# Patient Record
Sex: Male | Born: 1995
Health system: Southern US, Community
[De-identification: ages and names within clinical notes are randomized; demographics above are authoritative.]

## PROBLEM LIST (undated history)

## (undated) HISTORY — PX: NO PAST SURGERIES: SHX2092

---
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
## 2014-01-07 ENCOUNTER — Emergency Department: Payer: Self-pay | Admitting: Emergency Medicine
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
## 2019-12-31 ENCOUNTER — Encounter: Payer: Self-pay | Admitting: Emergency Medicine

## 2019-12-31 ENCOUNTER — Other Ambulatory Visit: Payer: Self-pay

## 2019-12-31 ENCOUNTER — Emergency Department
Admission: EM | Admit: 2019-12-31 | Discharge: 2019-12-31 | Disposition: A | Discharge status: Home / Self Care | Payer: Self-pay | Attending: Emergency Medicine | Admitting: Emergency Medicine

## 2019-12-31 ENCOUNTER — Emergency Department: Payer: Self-pay

## 2019-12-31 DIAGNOSIS — F17210 Nicotine dependence, cigarettes, uncomplicated: Secondary | ICD-10-CM | POA: Insufficient documentation

## 2019-12-31 DIAGNOSIS — M79671 Pain in right foot: Secondary | ICD-10-CM | POA: Insufficient documentation

## 2019-12-31 MED ORDER — IBUPROFEN 600 MG PO TABS
600.0000 mg | ORAL_TABLET | Freq: Four times a day (QID) | ORAL | 0 refills | Status: DC | PRN
Start: 2019-12-31 — End: 2020-03-28

## 2019-12-31 MED ORDER — HYDROCODONE-ACETAMINOPHEN 5-325 MG PO TABS: 1.0000 | ORAL_TABLET | ORAL | 0 refills | Status: DC | PRN

## 2019-12-31 MED ORDER — HYDROCODONE-ACETAMINOPHEN 5-325 MG PO TABS
1.0000 | ORAL_TABLET | Freq: Once | ORAL | Status: AC
  Administered 2019-12-31: 1 via ORAL
  Filled 2019-12-31: qty 1

## 2019-12-31 NOTE — ED Provider Notes (Signed)
[facility] Emergency Department Provider Note  Time seen: 3:39 AM  I have reviewed the triage vital signs and the nursing notes.   HISTORY  Chief Complaint Foot Pain   HPI Dustin Clark is a 24 y.o. male with no significant past medical history presents to the emergency department for right foot pain.  According to the patient he states he walked into a staircase 2 days ago.  States he has had significant pain at the base of the right great toe ever since.  Patient denies any history of gout.  States the pain seemed to worsen today while he was walking on it and he could not get sleep tonight due to the discomfort.  No other injuries.  Describes the pain as moderate aching dull pain.   History reviewed. No pertinent past medical history.  There are no problems to display for this patient.   History reviewed. No pertinent surgical history.  Prior to Admission medications   Not on File    No Known Allergies  No family history on file.  Social History Social History   Tobacco Use  . Smoking status: Current Every Day Smoker  . Smokeless tobacco: Never Used  Substance Use Topics  . Alcohol use: Yes    Comment: occ  . Drug use: Never    Review of Systems Constitutional: Negative for fever. Cardiovascular: Negative for chest pain. Respiratory: Negative for shortness of breath. Gastrointestinal: Negative for abdominal pain, vomiting and diarrhea. Musculoskeletal: Right foot pain. Neurological: Negative for headache All other ROS negative  ____________________________________________   PHYSICAL EXAM:  VITAL SIGNS: ED Triage Vitals  Enc Vitals Group     BP 12/31/19 0240 117/84     Pulse Rate 12/31/19 0240 88     Resp 12/31/19 0240 18     Temp 12/31/19 0240 98.6 F (37 C)     Temp Source 12/31/19 0240 Oral     SpO2 12/31/19 0240 98 %     Weight 12/31/19 0238 273 lb (123.8 kg)     Height 12/31/19 0238 6\' 1"  (1.854 m)     Head  Circumference --      Peak Flow --      Pain Score 12/31/19 0238 9     Pain Loc --      Pain Edu? --      Excl. in [place]? --    Constitutional: Alert and oriented. Well appearing and in no distress. Eyes: Normal exam ENT      Head: Normocephalic and atraumatic.      Mouth/Throat: Mucous membranes are moist. Cardiovascular: Normal rate, regular rhythm. Respiratory: Normal respiratory effort without tachypnea nor retractions. Breath sounds are clear  Gastrointestinal: Soft and nontender. No distention. Musculoskeletal: Moderate tenderness to palpation however the right MTP joint with mild swelling to this area mild erythema. Neurologic:  Normal speech and language. No gross focal neurologic deficits Skin:  Skin is warm, dry and intact.  Psychiatric: Mood and affect are normal.  ____________________________________________     RADIOLOGY  X-ray negative  ____________________________________________   INITIAL IMPRESSION / ASSESSMENT AND PLAN / ED COURSE  Pertinent labs & imaging results that were available during my care of the patient were reviewed by me and considered in my medical decision making (see chart for details).   Patient presents emergency department for right foot pain after hitting it and a staircase 2 days ago.  Overall the patient appears well no other injuries identified on exam reassuring physical  exam.  Does have tenderness to the right MTP joint with mild swelling and erythema to the area.  Could very likely be traumatic injury.  I reviewed the x-ray images do not see any acute fracture.  Patient denies any history of gout.  We will place on a short course of pain medication as well as NSAIDs.  Patient agreeable to plan of care.  Dustin Clark was evaluated in Emergency Department on 12/31/2019 for the symptoms described in the history of present illness. He was evaluated in the context of the global COVID-19 pandemic, which necessitated consideration that the  patient might be at risk for infection with the SARS-CoV-2 virus that causes COVID-19. Institutional protocols and algorithms that pertain to the evaluation of patients at risk for COVID-19 are in a state of rapid change based on information released by regulatory bodies including the CDC and federal and state organizations. These policies and algorithms were followed during the patient's care in the ED.  ____________________________________________   FINAL CLINICAL IMPRESSION(S) / ED DIAGNOSES  Right foot pain   Minna Antis, MD 12/31/19 [phone]

## 2019-12-31 NOTE — ED Triage Notes (Signed)
Patient states that three days ago he was walking and hit his left foot on a stair case. Patient states that he is now having pain.

## 2020-03-28 ENCOUNTER — Other Ambulatory Visit: Payer: Self-pay

## 2020-03-28 ENCOUNTER — Encounter: Payer: Self-pay | Admitting: Emergency Medicine

## 2020-03-28 ENCOUNTER — Ambulatory Visit: Payer: Self-pay

## 2020-03-28 ENCOUNTER — Ambulatory Visit
Admission: EM | Admit: 2020-03-28 | Discharge: 2020-03-28 | Disposition: A | Discharge status: Home / Self Care | Payer: Self-pay | Attending: Family Medicine | Admitting: Family Medicine

## 2020-03-28 DIAGNOSIS — A6920 Lyme disease, unspecified: Secondary | ICD-10-CM

## 2020-03-28 DIAGNOSIS — W57XXXA Bitten or stung by nonvenomous insect and other nonvenomous arthropods, initial encounter: Secondary | ICD-10-CM

## 2020-03-28 MED ORDER — DOXYCYCLINE HYCLATE 100 MG PO CAPS
100.0000 mg | ORAL_CAPSULE | Freq: Two times a day (BID) | ORAL | 0 refills | Status: DC
Start: 2020-03-28 — End: 2023-07-31

## 2020-03-28 NOTE — ED Triage Notes (Signed)
Pt states he was bite by a tick about a week ago on his upper back.. He has a bulls eye type rash. Denies fever or body aches.

## 2020-03-28 NOTE — ED Provider Notes (Signed)
[facility]    CSN: [id number] Arrival date & time: 03/28/20  1032  History   Chief Complaint Chief Complaint  Patient presents with  . Insect Bite    tick   HPI  24 year old male presents with recent tick bite.  Patient reports that he was bitten by tick approximately 1 week ago.  Was bitten on the back.  Patient reports that 2 to 3 days ago he noticed a rash which seems to be enlarging.  He has had no fever or arthralgias or myalgias.  No headache.  He is otherwise feeling well.  However given the rash, he was concerned and decided to come in for evaluation.  No relieving factors.  No other associated symptoms.  No other complaints.  Past Surgical History:  Procedure Laterality Date  . NO PAST SURGERIES     Home Medications    Prior to Admission medications   Medication Sig Start Date End Date Taking? Authorizing Provider  doxycycline (VIBRAMYCIN) 100 MG capsule Take 1 capsule (100 mg total) by mouth 2 (two) times daily. 03/28/20   Tommie Sams, DO    Family History Family History  Problem Relation Age of Onset  . Lupus Mother   . Healthy Father     Social History Social History   Tobacco Use  . Smoking status: Former Smoker    Types: Cigarettes  . Smokeless tobacco: Never Used  Vaping Use  . Vaping Use: Never used  Substance Use Topics  . Alcohol use: Yes    Comment: occ  . Drug use: Yes    Types: Marijuana     Allergies   Patient has no known allergies.   Review of Systems Review of Systems  Constitutional: Negative.   Musculoskeletal: Negative.   Skin: Positive for rash.   Physical Exam Triage Vital Signs ED Triage Vitals  Enc Vitals Group     BP 03/28/20 1106 114/86     Pulse Rate 03/28/20 1106 65     Resp 03/28/20 1106 18     Temp 03/28/20 1106 97.8 F (36.6 C)     Temp Source 03/28/20 1106 Oral     SpO2 03/28/20 1106 100 %     Weight 03/28/20 1104 272 lb 14.9 oz (123.8 kg)     Height 03/28/20 1104 6\' 1"  (1.854 m)      Head Circumference --      Peak Flow --      Pain Score 03/28/20 1104 0     Pain Loc --      Pain Edu? --      Excl. in [place]? --    Updated Vital Signs BP 114/86 (BP Location: Right Arm)   Pulse 65   Temp 97.8 F (36.6 C) (Oral)   Resp 18   Ht 6\' 1"  (1.854 m)   Wt 123.8 kg   SpO2 100%   BMI 36.01 kg/m   Visual Acuity Right Eye Distance:   Left Eye Distance:   Bilateral Distance:    Right Eye Near:   Left Eye Near:    Bilateral Near:     Physical Exam Constitutional:      General: He is not in acute distress.    Appearance: Normal appearance. He is not ill-appearing.  HENT:     Head: Normocephalic and atraumatic.  Eyes:     General:        Right eye: No discharge.        Left eye: No discharge.  Conjunctiva/sclera: Conjunctivae normal.  Cardiovascular:     Rate and Rhythm: Normal rate and regular rhythm.  Pulmonary:     Effort: Pulmonary effort is normal. No respiratory distress.  Skin:    Comments: Back with the area of rash that appears to be consistent with erythema migrans.  See picture.  Neurological:     Mental Status: He is alert.  Psychiatric:        Mood and Affect: Mood normal.        Behavior: Behavior normal.        UC Treatments / Results  Labs (all labs ordered are listed, but only abnormal results are displayed) Labs Reviewed - No data to display  EKG   Radiology No results found.  Procedures Procedures (including critical care time)  Medications Ordered in UC Medications - No data to display  Initial Impression / Assessment and Plan / UC Course  I have reviewed the triage vital signs and the nursing notes.  Pertinent labs & imaging results that were available during my care of the patient were reviewed by me and considered in my medical decision making (see chart for details).    24 year old male presents with erythema migrans.  Treating with doxycycline.  Final Clinical Impressions(s) / UC Diagnoses   Final diagnoses:   Erythema migrans (Lyme disease)  Tick bite, initial encounter   Discharge Instructions   None    ED Prescriptions    Medication Sig Dispense Auth. Provider   doxycycline (VIBRAMYCIN) 100 MG capsule Take 1 capsule (100 mg total) by mouth 2 (two) times daily. 20 capsule Tommie Sams, DO     PDMP not reviewed this encounter.   Tommie Sams, [place] 03/28/20 1303

## 2022-12-26 ENCOUNTER — Ambulatory Visit (INDEPENDENT_AMBULATORY_CARE_PROVIDER_SITE_OTHER): Payer: Self-pay

## 2022-12-26 ENCOUNTER — Ambulatory Visit
Admission: EM | Admit: 2022-12-26 | Discharge: 2022-12-26 | Disposition: A | Discharge status: Home / Self Care | Payer: Self-pay | Attending: Nurse Practitioner | Admitting: Nurse Practitioner

## 2022-12-26 DIAGNOSIS — S9032XA Contusion of left foot, initial encounter: Secondary | ICD-10-CM

## 2022-12-26 MED ORDER — NAPROXEN 500 MG PO TABS: 500.0000 mg | ORAL_TABLET | Freq: Two times a day (BID) | ORAL | 0 refills | Status: AC

## 2022-12-26 NOTE — ED Triage Notes (Addendum)
Pt c/o L foot pain x1 day. States car starter fell on foot.

## 2022-12-26 NOTE — Discharge Instructions (Signed)
Your x-ray was negative for fracture.  Start naproxen twice daily for 7 days Rest, elevate, ice to the foot as needed.  Ace wrap to help with swelling Follow-up with your PCP if your symptoms do not improve Please go to the ER if you have any worsening symptoms

## 2022-12-26 NOTE — ED Provider Notes (Signed)
[facility]    CSN: [id number] Arrival date & time: 12/26/22  1810      History   Chief Complaint Chief Complaint  Patient presents with   Foot Pain    HPI Dustin Clark is a 27 y.o. male presents for evaluation of foot pain.  Patient reports yesterday while working on his car he dropped a starter on the top of his foot.  States it weighed about 15 pounds.  Had immediate pain and swelling that has persisted since then.  He states he is able to bear weight with significant pain.  Endorses swelling and bruising.  No numbness or tingling.  States he broke this foot when he was a child and had to wear a cast for period of time but denies surgeries.  He has been taking Aleve without improvement in pain.  No other concerns at this time.   Foot Pain    History reviewed. No pertinent past medical history.  There are no problems to display for this patient.   Past Surgical History:  Procedure Laterality Date   NO PAST SURGERIES         Home Medications    Prior to Admission medications   Medication Sig Start Date End Date Taking? Authorizing Provider  doxycycline (VIBRAMYCIN) 100 MG capsule Take 1 capsule (100 mg total) by mouth 2 (two) times daily. 03/28/20   Tommie Sams, DO    Family History Family History  Problem Relation Age of Onset   Lupus Mother    Healthy Father     Social History Social History   Tobacco Use   Smoking status: Former    Types: Cigarettes   Smokeless tobacco: Never  Vaping Use   Vaping Use: Never used  Substance Use Topics   Alcohol use: Yes    Comment: occ   Drug use: Yes    Types: Marijuana     Allergies   Patient has no known allergies.   Review of Systems Review of Systems  Musculoskeletal:        Left foot pain      Physical Exam Triage Vital Signs ED Triage Vitals  Enc Vitals Group     BP 12/26/22 1813 123/83     Pulse Rate 12/26/22 1813 80     Resp 12/26/22 1813 16     Temp 12/26/22 1813 98.4 F  (36.9 C)     Temp Source 12/26/22 1813 Oral     SpO2 12/26/22 1813 96 %     Weight 12/26/22 1813 270 lb (122.5 kg)     Height 12/26/22 1813  (1.854 m)     Head Circumference --      Peak Flow --      Pain Score 12/26/22 1819 10     Pain Loc --      Pain Edu? --      Excl. in [place]? --    No data found.  Updated Vital Signs BP 123/83 (BP Location: Left Arm)   Pulse 80   Temp 98.4 F (36.9 C) (Oral)   Resp 16   Ht  (1.854 m)   Wt 270 lb (122.5 kg)   SpO2 96%   BMI 35.62 kg/m   Visual Acuity Right Eye Distance:   Left Eye Distance:   Bilateral Distance:    Right Eye Near:   Left Eye Near:    Bilateral Near:     Physical Exam Vitals and nursing note reviewed.  Constitutional:      Appearance: Normal appearance.  Eyes:     Pupils: Pupils are equal, round, and reactive to light.  Cardiovascular:     Rate and Rhythm: Normal rate.  Pulmonary:     Effort: Pulmonary effort is normal.  Musculoskeletal:       Feet:  Feet:     Comments: TTP from proximal first metatarsal that extends to great toe.  Mild swelling without ecchymosis.  No deformity.  DP +2. Skin:    General: Skin is warm and dry.  Neurological:     General: No focal deficit present.     Mental Status: He is alert and oriented to person, place, and time.  Psychiatric:        Mood and Affect: Mood normal.        Behavior: Behavior normal.      UC Treatments / Results  Labs (all labs ordered are listed, but only abnormal results are displayed) Labs Reviewed - No data to display  EKG   Radiology DG Foot Complete Left  Result Date: 12/26/2022 CLINICAL DATA:  Left foot pain.  Heavy object fell on foot. EXAM: LEFT FOOT - COMPLETE 3+ VIEW COMPARISON:  None Available. FINDINGS: There is no evidence of fracture or dislocation. There is no evidence of arthropathy or other focal bone abnormality. Soft tissues are unremarkable. IMPRESSION: Negative. Electronically Signed   By: Charlett Nose M.D.    On: 12/26/2022 18:54    Procedures Procedures (including critical care time)  Medications Ordered in UC Medications - No data to display  Initial Impression / Assessment and Plan / UC Course  I have reviewed the triage vital signs and the nursing notes.  Pertinent labs & imaging results that were available during my care of the patient were reviewed by me and considered in my medical decision making (see chart for details).     Foot x-ray negative for fracture.  Reviewed with patient. Discussed RICE therapy.  Ace wrap applied in clinic.  Patient requested crutches PCP follow-up if symptoms do not improve ER precautions reviewed and patient verbalized understanding Final Clinical Impressions(s) / UC Diagnoses   Final diagnoses:  Contusion of left foot, initial encounter   Discharge Instructions   None    ED Prescriptions   None    PDMP not reviewed this encounter.   Radford Pax, NP 12/26/22 [phone]

## 2023-07-31 ENCOUNTER — Ambulatory Visit
Admission: EM | Admit: 2023-07-31 | Discharge: 2023-07-31 | Disposition: A | Discharge status: Home / Self Care | Payer: Self-pay | Attending: Emergency Medicine | Admitting: Emergency Medicine

## 2023-07-31 ENCOUNTER — Ambulatory Visit: Payer: Self-pay

## 2023-07-31 DIAGNOSIS — J029 Acute pharyngitis, unspecified: Secondary | ICD-10-CM | POA: Insufficient documentation

## 2023-07-31 DIAGNOSIS — Z20822 Contact with and (suspected) exposure to covid-19: Secondary | ICD-10-CM | POA: Insufficient documentation

## 2023-07-31 DIAGNOSIS — S92414A Nondisplaced fracture of proximal phalanx of right great toe, initial encounter for closed fracture: Secondary | ICD-10-CM | POA: Insufficient documentation

## 2023-07-31 LAB — RESP PANEL BY RT-PCR (RSV, FLU A&B, COVID)  RVPGX2
Influenza A by PCR: NEGATIVE
Influenza B by PCR: NEGATIVE
Resp Syncytial Virus by PCR: NEGATIVE
SARS Coronavirus 2 by RT PCR: NEGATIVE

## 2023-07-31 LAB — GROUP A STREP BY PCR: Group A Strep by PCR: NOT DETECTED

## 2023-07-31 MED ORDER — NAPROXEN 500 MG PO TABS: 500.0000 mg | ORAL_TABLET | Freq: Two times a day (BID) | ORAL | 0 refills | Status: AC

## 2023-07-31 NOTE — ED Triage Notes (Signed)
Sore throat that started last night. No fever 4/10 pain.   Right foot injury. X 1 week. Patient states that he hot his right foot on the baby crib. 8/10 patient has been taking IBU for pain and swelling. Last dose last night of 400 mg.

## 2023-07-31 NOTE — Discharge Instructions (Addendum)
Strep PCR is negative.  Your COVID, flu and RSV testing is negative as well.  Make sure you drink plenty of extra fluids.  Some people find salt water gargles and  Traditional Medicinal's "Throat Coat" tea helpful. Take 5 mL of liquid Benadryl and 5 mL of Maalox. Mix it together, and then hold it in your mouth for as long as you can and then swallow. You may do this 4 times a day.  Honey and lemon dissolved in hot water can also be soothing.   You appear to have a very small fracture of your right toe.  The formal radiology report is pending.  We will contact you if the radiology report differs enough from my reading we need to change management.  Continue wearing the boot.  Take Naprosyn combined with 1000 mg of Tylenol twice a day as needed for pain.  May take an additional 1000 mg Tylenol per day.

## 2023-07-31 NOTE — ED Provider Notes (Signed)
HPI  SUBJECTIVE:  Dustin Clark is a 27 y.o. male who presents with 2 issues: First, he reports he sore throat starting this morning, mild headache.  No fevers, body aches, nasal congestion, rhinorrhea, postnasal drip, coughing, wheezing or shortness of breath, nausea [place], diarrhea, abdominal pain, rash, drooling, trismus, voice changes, sensation of throat swelling shut, neck stiffness.  No GERD or allergy symptoms.  No known COVID, strep, flu exposure.  He did not get the COVID or flu vaccines.  He has not tried anything for his symptoms.  No alleviating factors.  Symptoms are worse with swallowing.  He is concerned because he has a 89 month old newborn at home, and was requesting testing so that he can isolate appropriately.  Second, he reports right first toe/MTP pain after jamming his foot into the leg of the crib.  States that the pain is getting worse.  He describes it as sharp, constant.  Reports bruising, swelling, limitation of motion.  No erythema.  Denies injury to any other part of his foot.  He tried Tylenol, ibuprofen and wearing a boot with improvement in his symptoms.  Symptoms worse with weightbearing.  He has a past medical history of diabetes, hypertension, is status post tonsillectomy.  He has a history of right foot injury/contusion, and was given a boot here.  History reviewed. No pertinent past medical history.  Past Surgical History:  Procedure Laterality Date   NO PAST SURGERIES      Family History  Problem Relation Age of Onset   Lupus Mother    Healthy Father     Social History   Tobacco Use   Smoking status: Former    Types: Cigarettes    Passive exposure: Never   Smokeless tobacco: Never  Vaping Use   Vaping status: Never Used  Substance Use Topics   Alcohol use: Yes    Comment: occ   Drug use: Yes    Types: Marijuana    No current facility-administered medications for this encounter.  Current Outpatient Medications:    naproxen (NAPROSYN) 500  MG tablet, Take 1 tablet (500 mg total) by mouth 2 (two) times daily., Disp: 20 tablet, Rfl: 0  No Known Allergies   ROS  As noted in HPI.   Physical Exam  BP (!) 131/93 (BP Location: Right Arm)   Pulse (!) 57   Temp 98 F (36.7 C) (Oral)   Resp 18   SpO2 98%   Constitutional: Well developed, well nourished, no acute distress Eyes:  EOMI, conjunctiva normal bilaterally HENT: Normocephalic, atraumatic,mucus membranes moist.  Mild nasal congestion.  Tonsils surgically absent.  Erythematous oropharynx.  No postnasal drip.  Positive cobblestoning. Neck: Positive cervical lymphadenopathy Respiratory: Normal inspiratory effort Cardiovascular: Normal rate, regular rhythm, no murmurs GI: nondistended soft, nontender, no splenomegaly skin: No rash, skin intact Musculoskeletal: Right foot: Tenderness at the base of the first proximal phalanx/MTP joint.  Limited motion of the toe due to pain.  Sensation to light touch and temperature decreased but present.  Cap refill less than 2 seconds.  Rest of the foot nontender.  DP 2+. Neurologic: Alert & oriented x 3, no focal neuro deficits Psychiatric: Speech and behavior appropriate   ED Course   Medications - No data to display  Orders Placed This Encounter  Procedures   Group A Strep by PCR    Standing Status:   Standing    Number of Occurrences:   1   Resp panel by RT-PCR (RSV, Flu A&B,  Covid) Anterior Nasal Swab    Standing Status:   Standing    Number of Occurrences:   1   DG Toe Great Right    Standing Status:   Standing    Number of Occurrences:   1    Order Specific Question:   Reason for Exam (SYMPTOM  OR DIAGNOSIS REQUIRED)    Answer:   Jammed first toe 1 week ago, tenderness at MTP joint.  Rule out fracture   Airborne and Contact precautions    Standing Status:   Standing    Number of Occurrences:   1    Results for orders placed or performed during the hospital encounter of 07/31/23 (from the past 24 hour(s))  Group A  Strep by PCR     Status: None   Collection Time: 07/31/23  3:02 PM   Specimen: Throat; Sterile Swab  Result Value Ref Range   Group A Strep by PCR NOT DETECTED NOT DETECTED  Resp panel by RT-PCR (RSV, Flu A&B, Covid) Anterior Nasal Swab     Status: None   Collection Time: 07/31/23  3:02 PM   Specimen: Anterior Nasal Swab  Result Value Ref Range   SARS Coronavirus 2 by RT PCR NEGATIVE NEGATIVE   Influenza A by PCR NEGATIVE NEGATIVE   Influenza B by PCR NEGATIVE NEGATIVE   Resp Syncytial Virus by PCR NEGATIVE NEGATIVE   DG Toe Great Right  Result Date: 07/31/2023 CLINICAL DATA:  Right first toe injury 1 week ago with tenderness of the first MTP joint. EXAM: RIGHT GREAT TOE COMPARISON:  Right foot films on 12/31/2019 FINDINGS: While there was some pre-existing irregularity along the medial base of the first proximal phalanx on the prior study, irregularity appears more prominent on the current study, especially along the dorsal aspect of the bone on the lateral view and a subtle acute nondisplaced fracture is suspected. No additional injuries. Soft tissues appear unremarkable. IMPRESSION: Suspected subtle acute nondisplaced fracture of the medial base of the first proximal phalanx. Electronically Signed   By: Irish Lack M.D.   On: 07/31/2023 16:04    ED Clinical Impression  1. Pharyngitis, unspecified etiology   2. Encounter for laboratory testing for COVID-19 virus   3. Closed nondisplaced fracture of proximal phalanx of right great toe, initial encounter      ED Assessment/Plan     1.  Sore throat starting today.  Patient requesting strep, COVID, flu testing.  Will also include RSV testing because he has a 49-month-old at home.  Strep,, COVID, flu, RSV testing negative.  Home with Naprosyn/Tylenol, Benadryl/Maalox mixture.  2.  Left first toe/MTP pain after direct trauma.  Reviewed imaging independently.  He appears to have a small fracture of the base of the proximal phalanx  seen on lateral view.  Formal radiology report pending at this time.  However, he is neurovascularly intact.  I suspect a small fracture of the base of the proximal phalanx.  Continue boot.  Naprosyn/Tylenol.  Follow-up with podiatry in several weeks if symptoms persist.  Reviewed radiology report.  Nondisplaced fracture of the medial base of first proximal phalanx consistent with my read.  See radiology report for full details.  Continue current management.  Discussed labs, imaging, MDM, treatment plan, and plan for follow-up with patient. Discussed sn/sx that should prompt return to the ED. patient agrees with plan.   Meds ordered this encounter  Medications   naproxen (NAPROSYN) 500 MG tablet    Sig: Take 1 tablet (  500 mg total) by mouth 2 (two) times daily.    Dispense:  20 tablet    Refill:  0      *This clinic note was created using Scientist, clinical (histocompatibility and immunogenetics). Therefore, there may be occasional mistakes despite careful proofreading.  ?    Domenick Gong, MD 08/03/23 1146

## 2023-08-09 DIAGNOSIS — Z419 Encounter for procedure for purposes other than remedying health state, unspecified: Secondary | ICD-10-CM | POA: Diagnosis not present

## 2023-08-27 DIAGNOSIS — H5213 Myopia, bilateral: Secondary | ICD-10-CM | POA: Diagnosis not present

## 2023-09-09 DIAGNOSIS — Z419 Encounter for procedure for purposes other than remedying health state, unspecified: Secondary | ICD-10-CM | POA: Diagnosis not present

## 2023-10-10 DIAGNOSIS — Z419 Encounter for procedure for purposes other than remedying health state, unspecified: Secondary | ICD-10-CM | POA: Diagnosis not present

## 2023-11-07 DIAGNOSIS — Z419 Encounter for procedure for purposes other than remedying health state, unspecified: Secondary | ICD-10-CM | POA: Diagnosis not present

## 2023-12-19 DIAGNOSIS — Z419 Encounter for procedure for purposes other than remedying health state, unspecified: Secondary | ICD-10-CM | POA: Diagnosis not present

## 2024-01-18 DIAGNOSIS — Z419 Encounter for procedure for purposes other than remedying health state, unspecified: Secondary | ICD-10-CM | POA: Diagnosis not present

## 2024-02-03 DIAGNOSIS — S59902A Unspecified injury of left elbow, initial encounter: Secondary | ICD-10-CM | POA: Diagnosis not present

## 2024-02-03 DIAGNOSIS — L03114 Cellulitis of left upper limb: Secondary | ICD-10-CM | POA: Diagnosis not present

## 2024-02-04 DIAGNOSIS — S59902A Unspecified injury of left elbow, initial encounter: Secondary | ICD-10-CM | POA: Diagnosis not present

## 2024-02-04 DIAGNOSIS — L03114 Cellulitis of left upper limb: Secondary | ICD-10-CM | POA: Diagnosis not present

## 2024-02-18 DIAGNOSIS — Z419 Encounter for procedure for purposes other than remedying health state, unspecified: Secondary | ICD-10-CM | POA: Diagnosis not present

## 2024-03-19 DIAGNOSIS — Z419 Encounter for procedure for purposes other than remedying health state, unspecified: Secondary | ICD-10-CM | POA: Diagnosis not present

## 2024-04-19 DIAGNOSIS — Z419 Encounter for procedure for purposes other than remedying health state, unspecified: Secondary | ICD-10-CM | POA: Diagnosis not present

## 2024-05-20 DIAGNOSIS — Z419 Encounter for procedure for purposes other than remedying health state, unspecified: Secondary | ICD-10-CM | POA: Diagnosis not present

## 2024-07-20 DIAGNOSIS — Z419 Encounter for procedure for purposes other than remedying health state, unspecified: Secondary | ICD-10-CM | POA: Diagnosis not present

## 2024-08-19 DIAGNOSIS — Z419 Encounter for procedure for purposes other than remedying health state, unspecified: Secondary | ICD-10-CM | POA: Diagnosis not present
